# Patient Record
Sex: Male | Born: 1985 | Race: Black or African American | Hispanic: No | Marital: Single | Smoking: Current every day smoker
Health system: Southern US, Community
[De-identification: ages and names within clinical notes are randomized; demographics above are authoritative.]

## PROBLEM LIST (undated history)

## (undated) DIAGNOSIS — W3400XA Accidental discharge from unspecified firearms or gun, initial encounter: Secondary | ICD-10-CM

## (undated) DIAGNOSIS — F329 Major depressive disorder, single episode, unspecified: Secondary | ICD-10-CM

## (undated) DIAGNOSIS — F22 Delusional disorders: Secondary | ICD-10-CM

## (undated) DIAGNOSIS — R0600 Dyspnea, unspecified: Secondary | ICD-10-CM

## (undated) DIAGNOSIS — S82899A Other fracture of unspecified lower leg, initial encounter for closed fracture: Secondary | ICD-10-CM

## (undated) DIAGNOSIS — F32A Depression, unspecified: Secondary | ICD-10-CM

## (undated) DIAGNOSIS — R569 Unspecified convulsions: Secondary | ICD-10-CM

## (undated) HISTORY — PX: FRACTURE SURGERY: SHX138

## (undated) HISTORY — PX: BRAIN SURGERY: SHX531

## (undated) HISTORY — PX: LEG SURGERY: SHX1003

---

## 2012-01-01 ENCOUNTER — Emergency Department (HOSPITAL_COMMUNITY): Payer: Self-pay

## 2012-01-01 ENCOUNTER — Inpatient Hospital Stay (HOSPITAL_COMMUNITY)
Admission: EM | Admit: 2012-01-01 | Discharge: 2012-01-01 | DRG: 948 | Disposition: A | Discharge status: Home / Self Care | Payer: Self-pay | Attending: Emergency Medicine | Admitting: Emergency Medicine

## 2012-01-01 ENCOUNTER — Encounter (HOSPITAL_COMMUNITY): Payer: Self-pay | Admitting: Emergency Medicine

## 2012-01-01 ENCOUNTER — Other Ambulatory Visit: Payer: Self-pay

## 2012-01-01 DIAGNOSIS — I498 Other specified cardiac arrhythmias: Secondary | ICD-10-CM | POA: Diagnosis present

## 2012-01-01 DIAGNOSIS — R4182 Altered mental status, unspecified: Principal | ICD-10-CM | POA: Diagnosis present

## 2012-01-01 DIAGNOSIS — R5381 Other malaise: Secondary | ICD-10-CM | POA: Diagnosis present

## 2012-01-01 DIAGNOSIS — Y92009 Unspecified place in unspecified non-institutional (private) residence as the place of occurrence of the external cause: Secondary | ICD-10-CM

## 2012-01-01 DIAGNOSIS — T40601A Poisoning by unspecified narcotics, accidental (unintentional), initial encounter: Secondary | ICD-10-CM

## 2012-01-01 DIAGNOSIS — S7290XD Unspecified fracture of unspecified femur, subsequent encounter for closed fracture with routine healing: Secondary | ICD-10-CM

## 2012-01-01 LAB — CBC WITH DIFFERENTIAL/PLATELET
Basophils Absolute: 0 10*3/uL (ref 0.0–0.1)
Lymphocytes Relative: 15 % (ref 12–46)
Neutro Abs: 8.4 10*3/uL — ABNORMAL HIGH (ref 1.7–7.7)
Neutrophils Relative %: 75 % (ref 43–77)
Platelets: 277 10*3/uL (ref 150–400)
RDW: 13.5 % (ref 11.5–15.5)
WBC: 11.1 10*3/uL — ABNORMAL HIGH (ref 4.0–10.5)

## 2012-01-01 LAB — RAPID URINE DRUG SCREEN, HOSP PERFORMED
Barbiturates: NOT DETECTED
Cocaine: NOT DETECTED
Tetrahydrocannabinol: POSITIVE — AB

## 2012-01-01 LAB — COMPREHENSIVE METABOLIC PANEL
ALT: 22 U/L (ref 0–53)
AST: 45 U/L — ABNORMAL HIGH (ref 0–37)
CO2: 23 mEq/L (ref 19–32)
Calcium: 9.1 mg/dL (ref 8.4–10.5)
Chloride: 101 mEq/L (ref 96–112)
GFR calc non Af Amer: >90 mL/min (ref >90)
Sodium: 139 mEq/L (ref 135–145)
Total Bilirubin: 1.6 mg/dL — ABNORMAL HIGH (ref 0.3–1.2)

## 2012-01-01 MED ORDER — NALOXONE HCL 1 MG/ML IJ SOLN: 1.0000 mg | Freq: Once | INTRAMUSCULAR | Status: DC

## 2012-01-01 MED ORDER — SODIUM CHLORIDE 0.9 % IV SOLN
INTRAVENOUS | Status: DC
  Administered 2012-01-01: 19:00:00 via INTRAVENOUS

## 2012-01-01 MED ORDER — NALOXONE HCL 0.4 MG/ML IJ SOLN
INTRAMUSCULAR | Status: AC
  Filled 2012-01-01: qty 1

## 2012-01-01 MED ORDER — ZIPRASIDONE MESYLATE 20 MG IM SOLR
20.0000 mg | Freq: Once | INTRAMUSCULAR | Status: DC
  Filled 2012-01-01: qty 20

## 2012-01-01 MED ORDER — LORAZEPAM 2 MG/ML IJ SOLN
1.0000 mg | Freq: Once | INTRAMUSCULAR | Status: AC
  Administered 2012-01-01: 1 mg via INTRAVENOUS
  Filled 2012-01-01: qty 1

## 2012-01-01 NOTE — ED Notes (Signed)
Pt returned from xray, alert and oriented per normal, stated he wants to leave, pt refusing care and arguing with family at bedside. MD Ignacia Palma and PA notified.
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
## 2012-01-01 NOTE — ED Notes (Signed)
Upon entering room pt noted to be pale and diaphoretic, pt vital signs rechecked and within normal limits, pt with repetitive statements and behaving inappropriately, checked with previous RN that patient was not diaphoretic upon arrival, MD notified. EKG being obtained.

## 2012-01-01 NOTE — ED Notes (Signed)
Pt calm and cooperative at this time, requesting to be discharged, explained to patient that he cannot leave at this time due to IVC paperwork. Pt alert and oriented.
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
## 2012-01-01 NOTE — ED Notes (Signed)
Pt asking why he is being admitted, states he wants to leave, MD aware, pt alert and oriented.

## 2012-01-01 NOTE — ED Notes (Addendum)
Pt remains in radiology at this time. Family to bedside, states they suspect that patient started using unknown drugs again yesterday, states patient left house yesterday and abnormal behavior per family started yesterday evening, states this behavior is consistent of patient when he has used drugs in the past.
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
## 2012-01-08 DIAGNOSIS — S7292XA Unspecified fracture of left femur, initial encounter for closed fracture: Secondary | ICD-10-CM | POA: Insufficient documentation

## 2012-01-16 ENCOUNTER — Encounter (HOSPITAL_COMMUNITY): Payer: Self-pay | Admitting: *Deleted

## 2012-01-16 ENCOUNTER — Emergency Department (HOSPITAL_COMMUNITY)
Admission: EM | Admit: 2012-01-16 | Discharge: 2012-01-16 | Disposition: A | Discharge status: Home / Self Care | Payer: Self-pay | Attending: Emergency Medicine | Admitting: Emergency Medicine

## 2012-01-16 DIAGNOSIS — Z87828 Personal history of other (healed) physical injury and trauma: Secondary | ICD-10-CM | POA: Insufficient documentation

## 2012-01-16 DIAGNOSIS — T391X1A Poisoning by 4-Aminophenol derivatives, accidental (unintentional), initial encounter: Secondary | ICD-10-CM | POA: Insufficient documentation

## 2012-01-16 DIAGNOSIS — M79609 Pain in unspecified limb: Secondary | ICD-10-CM | POA: Insufficient documentation

## 2012-01-16 DIAGNOSIS — M79606 Pain in leg, unspecified: Secondary | ICD-10-CM

## 2012-01-16 DIAGNOSIS — F17200 Nicotine dependence, unspecified, uncomplicated: Secondary | ICD-10-CM | POA: Insufficient documentation

## 2012-01-16 HISTORY — DX: Accidental discharge from unspecified firearms or gun, initial encounter: W34.00XA

## 2012-01-16 LAB — ETHANOL: Alcohol, Ethyl (B): <11 mg/dL (ref 0–11)

## 2012-01-16 LAB — COMPREHENSIVE METABOLIC PANEL
AST: 15 U/L (ref 0–37)
Albumin: 4.3 g/dL (ref 3.5–5.2)
Alkaline Phosphatase: 102 U/L (ref 39–117)
Chloride: 100 mEq/L (ref 96–112)
Creatinine, Ser: 1.07 mg/dL (ref 0.50–1.35)
Potassium: 3.9 mEq/L (ref 3.5–5.1)
Sodium: 138 mEq/L (ref 135–145)
Total Bilirubin: 0.9 mg/dL (ref 0.3–1.2)

## 2012-01-16 LAB — RAPID URINE DRUG SCREEN, HOSP PERFORMED
Barbiturates: NOT DETECTED
Tetrahydrocannabinol: POSITIVE — AB

## 2012-01-16 LAB — CBC WITH DIFFERENTIAL/PLATELET
Eosinophils Absolute: 0.3 10*3/uL (ref 0.0–0.7)
Eosinophils Relative: 2 % (ref 0–5)
Lymphs Abs: 3 10*3/uL (ref 0.7–4.0)
MCH: 25.8 pg — ABNORMAL LOW (ref 26.0–34.0)
MCV: 80.4 fL (ref 78.0–100.0)
Monocytes Relative: 10 % (ref 3–12)
Platelets: 584 10*3/uL — ABNORMAL HIGH (ref 150–400)
RBC: 4.84 MIL/uL (ref 4.22–5.81)

## 2012-01-16 MED ORDER — ONDANSETRON HCL 4 MG/2ML IJ SOLN
4.0000 mg | Freq: Once | INTRAMUSCULAR | Status: AC
  Administered 2012-01-16: 4 mg via INTRAVENOUS
  Filled 2012-01-16: qty 2

## 2012-01-16 MED ORDER — SODIUM CHLORIDE 0.9 % IV SOLN
Freq: Once | INTRAVENOUS | Status: AC
  Administered 2012-01-16: 04:00:00 via INTRAVENOUS

## 2012-01-16 NOTE — ED Notes (Signed)
NAD noted at time of d/c home 

## 2012-01-16 NOTE — ED Provider Notes (Signed)
Medical screening examination/treatment/procedure(s) were performed by non-physician practitioner and as supervising physician I was immediately available for consultation/collaboration.  Olivia Mackie, MD 01/16/12 [phone]

## 2012-01-16 NOTE — ED Notes (Signed)
Pt states he took 5-500mg  tylenols around 10-11am yesterday, and then 7 at 0200 today.

## 2012-01-16 NOTE — ED Notes (Signed)
Updated family member on pt. Health status.  Will see if she can visit in 10 Minutes.
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
## 2012-01-16 NOTE — ED Notes (Signed)
Pt appears very tired, states he is feeling sick. Pt states he has vomited multiple times, around 7-8 times. Pt sates his left leg is aching.
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
## 2012-02-09 ENCOUNTER — Emergency Department (INDEPENDENT_AMBULATORY_CARE_PROVIDER_SITE_OTHER): Payer: Self-pay

## 2012-02-09 ENCOUNTER — Encounter (HOSPITAL_COMMUNITY): Payer: Self-pay | Admitting: *Deleted

## 2012-02-09 ENCOUNTER — Emergency Department (INDEPENDENT_AMBULATORY_CARE_PROVIDER_SITE_OTHER)
Admission: EM | Admit: 2012-02-09 | Discharge: 2012-02-09 | Disposition: A | Discharge status: Home / Self Care | Payer: Self-pay | Source: Home / Self Care | Attending: Family Medicine | Admitting: Family Medicine

## 2012-02-09 DIAGNOSIS — M79609 Pain in unspecified limb: Secondary | ICD-10-CM

## 2012-02-09 DIAGNOSIS — M79605 Pain in left leg: Secondary | ICD-10-CM

## 2012-02-09 MED ORDER — OXYCODONE-ACETAMINOPHEN 10-325 MG PO TABS: 1.0000 | ORAL_TABLET | Freq: Four times a day (QID) | ORAL | Status: DC | PRN

## 2012-02-09 MED ORDER — HYDROCODONE-ACETAMINOPHEN 5-325 MG PO TABS
ORAL_TABLET | ORAL | Status: AC
  Filled 2012-02-09: qty 1

## 2012-02-09 MED ORDER — TRAMADOL HCL 50 MG PO TABS: 50.0000 mg | ORAL_TABLET | Freq: Four times a day (QID) | ORAL | Status: AC | PRN

## 2012-02-09 MED ORDER — HYDROCODONE-ACETAMINOPHEN 5-325 MG PO TABS
1.0000 | ORAL_TABLET | Freq: Once | ORAL | Status: AC
Start: 2012-02-09 — End: 2012-02-09
  Administered 2012-02-09: 1 via ORAL
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
## 2012-02-10 NOTE — ED Provider Notes (Signed)
Medical screening examination/treatment/procedure(s) were performed by resident physician or non-physician practitioner and as supervising physician I was immediately available for consultation/collaboration.   Barkley Bruns MD.    Linna Hoff, MD 02/10/12 [phone]

## 2012-04-22 DIAGNOSIS — M898X5 Other specified disorders of bone, thigh: Secondary | ICD-10-CM | POA: Insufficient documentation

## 2013-11-19 IMAGING — CT CT CERVICAL SPINE W/O CM
3 of 5 series · 8 of 20 positions shown, 9 images · non-contrast
Comparison: None

CT HEAD

Fall.  Trauma back of head.  Head and neck pain.  CT HEAD WITHOUT
CONTRAST
CT CERVICAL SPINE WITHOUT CONTRAST
TECHNIQUE: Multidetector CT imaging of the head and cervical spine
was performed following the standard protocol without intravenous
contrast.  Multiplanar CT image reconstructions of the cervical
spine were also generated.

[Series 3: recon 2: brain · axial · 0.47mm/px · z∈[-56,+27]mm · 3 of 64 slices shown]
[im 16/64  bone]
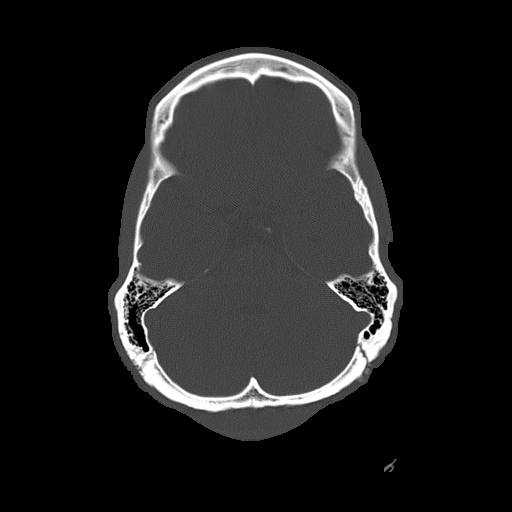
[im 32/64  bone]
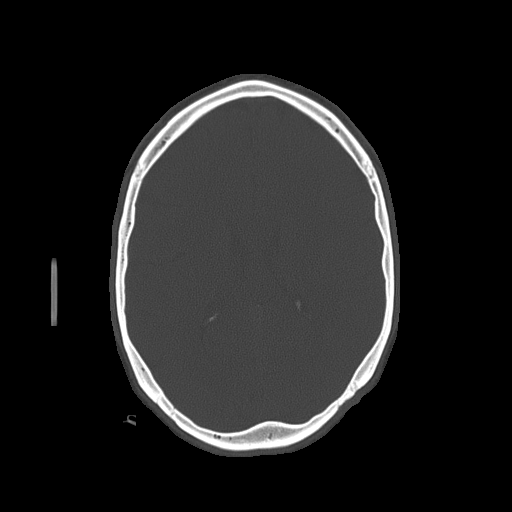
[im 48/64  bone]
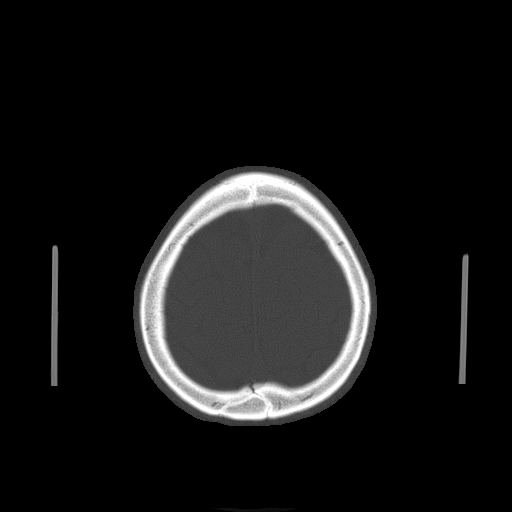

[Series 5: recon 2: c-spine · axial · 0.31mm/px · z∈[-251,-138]mm · 4 of 74 slices shown, 5 images]
[im 15/74  soft-tissue]
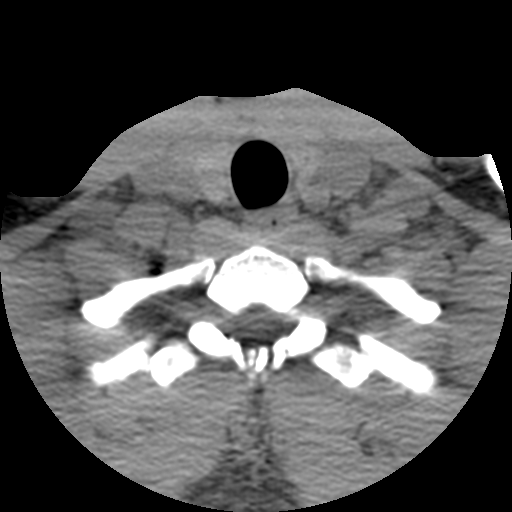
[im 15/74  bone]
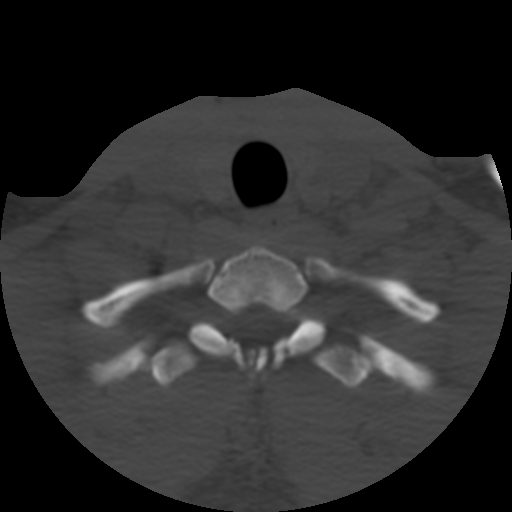
[im 30/74  bone]
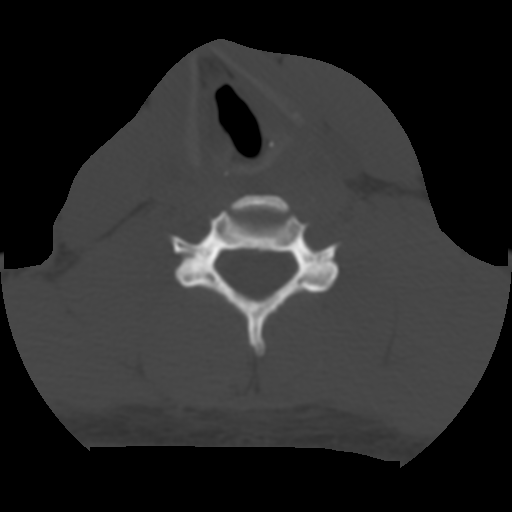
[im 44/74  bone]
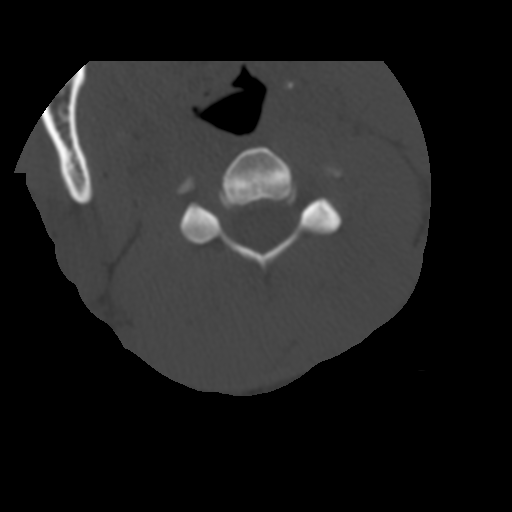
[im 59/74  bone]
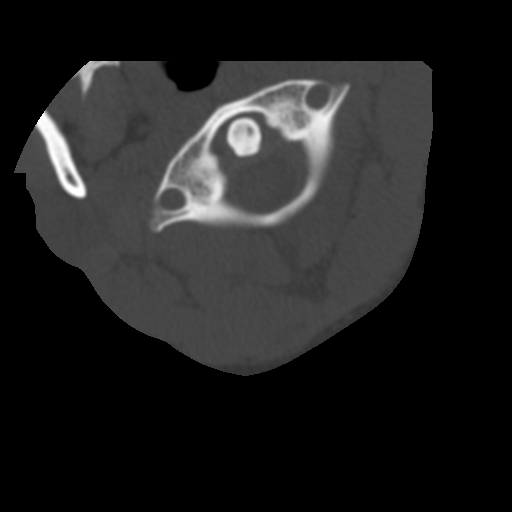

[Series 601: cor · coronal · 0.37mm/px · 1 of 34 slices shown]
[im 17/34  bone]
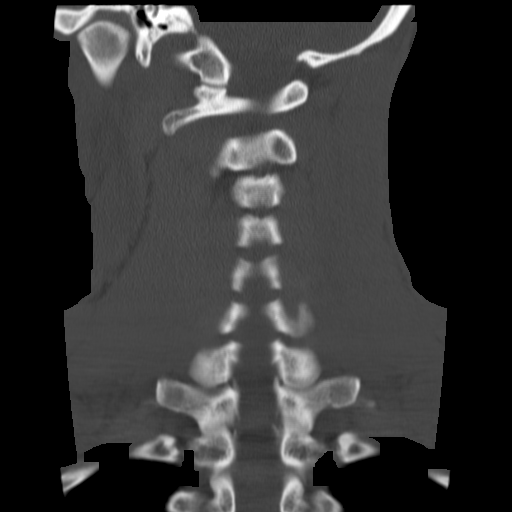

[8 of 20 positions shown; findings below may reference images not displayed]

FINDINGS: No acute cortical infarct, hemorrhage, or mass lesion is
present.  The ventricles are of normal size.  No significant extra-
axial fluid collection is present.

The paranasal sinuses and mastoid air cells are clear.  The osseous
skull is intact.

No significant extracranial soft tissue injury is evident.
IMPRESSION: Negative CT of the head.

CT CERVICAL SPINE
FINDINGS: The cervical spine is visualized from skull base through
T2.  The vertebral body heights and alignment are intact.  No acute
fracture or traumatic subluxation is evident.  There is
straightening of the normal cervical lordosis.  The soft tissues
are unremarkable.  The lung apices are clear.
IMPRESSION: 1.  No acute fracture or traumatic subluxation.
2.  Mild straightening of the normal cervical lordosis.  While this
is most likely positional, it can be seen in the setting of ongoing
pain or muscle strain.

## 2015-12-30 ENCOUNTER — Emergency Department (HOSPITAL_COMMUNITY)
Admission: EM | Admit: 2015-12-30 | Discharge: 2015-12-30 | Disposition: A | Discharge status: Home / Self Care | Payer: Self-pay | Attending: Emergency Medicine | Admitting: Emergency Medicine

## 2015-12-30 ENCOUNTER — Emergency Department (HOSPITAL_COMMUNITY): Payer: Self-pay

## 2015-12-30 ENCOUNTER — Encounter (HOSPITAL_COMMUNITY): Payer: Self-pay

## 2015-12-30 DIAGNOSIS — F17210 Nicotine dependence, cigarettes, uncomplicated: Secondary | ICD-10-CM | POA: Insufficient documentation

## 2015-12-30 DIAGNOSIS — R0789 Other chest pain: Secondary | ICD-10-CM | POA: Insufficient documentation

## 2015-12-30 DIAGNOSIS — Z79899 Other long term (current) drug therapy: Secondary | ICD-10-CM | POA: Insufficient documentation

## 2015-12-30 MED ORDER — NAPROXEN 500 MG PO TABS: 500.0000 mg | ORAL_TABLET | Freq: Two times a day (BID) | ORAL | Status: DC

## 2015-12-30 MED ORDER — CYCLOBENZAPRINE HCL 10 MG PO TABS: 10.0000 mg | ORAL_TABLET | Freq: Two times a day (BID) | ORAL | Status: DC | PRN

## 2015-12-30 NOTE — Discharge Instructions (Signed)
Chest Wall Pain °Chest wall pain is pain in or around the bones and muscles of your chest. Sometimes, an injury causes this pain. Sometimes, the cause may not be known. This pain may take several weeks or longer to get better. °HOME CARE °Pay attention to any changes in your symptoms. Take these actions to help with your pain: °· Rest as told by your doctor. °· Avoid activities that cause pain. Try not to use your chest, belly (abdominal), or side muscles to lift heavy things. °· If directed, apply ice to the painful area: °¨ Put ice in a plastic bag. °¨ Place a towel between your skin and the bag. °¨ Leave the ice on for 20 minutes, 2-3 times per day. °· Take over-the-counter and prescription medicines only as told by your doctor. °· Do not use tobacco products, including cigarettes, chewing tobacco, and e-cigarettes. If you need help quitting, ask your doctor. °· Keep all follow-up visits as told by your doctor. This is important. °GET HELP IF: °· You have a fever. °· Your chest pain gets worse. °· You have new symptoms. °GET HELP RIGHT AWAY IF: °· You feel sick to your stomach (nauseous) or you throw up (vomit). °· You feel sweaty or light-headed. °· You have a cough with phlegm (sputum) or you cough up blood. °· You are short of breath. °  °This information is not intended to replace advice given to you by your health care provider. Make sure you discuss any questions you have with your health care provider. °  °Document Released: 11/13/2007 Document Revised: 02/15/2015 Document Reviewed: 08/22/2014 °Elsevier Interactive Patient Education ©2016 Elsevier Inc. ° °

## 2015-12-30 NOTE — ED Notes (Signed)
Sitting on side of stretcher - called RN to room - states "I'm in pain". Advised pt B Tran, [place], will be in to assess him soon. Voiced understanding.

## 2015-12-30 NOTE — ED Notes (Signed)
Patient here with 1 week of right side pain. Pain worse with any inspiration and movement, denies trauma. denies cold symptoms. Alert and oriented, NAD
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
## 2018-05-04 ENCOUNTER — Other Ambulatory Visit: Payer: Self-pay | Admitting: Podiatry

## 2018-05-04 ENCOUNTER — Ambulatory Visit (INDEPENDENT_AMBULATORY_CARE_PROVIDER_SITE_OTHER): Payer: Self-pay | Admitting: Podiatry

## 2018-05-04 ENCOUNTER — Ambulatory Visit (INDEPENDENT_AMBULATORY_CARE_PROVIDER_SITE_OTHER): Payer: Self-pay

## 2018-05-04 ENCOUNTER — Ambulatory Visit: Payer: Self-pay

## 2018-05-04 DIAGNOSIS — S8252XA Displaced fracture of medial malleolus of left tibia, initial encounter for closed fracture: Secondary | ICD-10-CM

## 2018-05-04 DIAGNOSIS — M25471 Effusion, right ankle: Secondary | ICD-10-CM

## 2018-05-04 DIAGNOSIS — M7661 Achilles tendinitis, right leg: Secondary | ICD-10-CM

## 2018-05-04 MED ORDER — OXYCODONE-ACETAMINOPHEN 5-325 MG PO TABS: 1.0000 | ORAL_TABLET | Freq: Three times a day (TID) | ORAL | 0 refills | Status: AC | PRN

## 2018-05-04 NOTE — Patient Instructions (Signed)
Pre-Operative Instructions  Congratulations, you have decided to take an important step towards improving your quality of life.  You can be assured that the doctors and staff at [facility] will be with you every step of the way.  Here are some important things you should know:  1. Plan to be at the surgery center/hospital at least 1 (one) hour prior to your scheduled time, unless otherwise directed by the surgical center/hospital staff.  You must have a responsible adult accompany you, remain during the surgery and drive you home.  Make sure you have directions to the surgical center/hospital to ensure you arrive on time. 2. If you are having surgery at [facility] or [facility], you will need a copy of your medical history and physical form from your family physician within one month prior to the date of surgery. We will give you a form for your primary physician to complete.  3. We make every effort to accommodate the date you request for surgery.  However, there are times where surgery dates or times have to be moved.  We will contact you as soon as possible if a change in schedule is required.   4. No aspirin/ibuprofen for one week before surgery.  If you are on aspirin, any non-steroidal anti-inflammatory medications (Mobic, Aleve, Ibuprofen) should not be taken seven (7) days prior to your surgery.  You make take Tylenol for pain prior to surgery.  5. Medications - If you are taking daily heart and blood pressure medications, seizure, reflux, allergy, asthma, anxiety, pain or diabetes medications, make sure you notify the surgery center/hospital before the day of surgery so they can tell you which medications you should take or avoid the day of surgery. 6. No food or drink after midnight the night before surgery unless directed otherwise by surgical center/hospital staff. 7. No alcoholic beverages 24-hours prior to surgery.  No smoking 24-hours prior or 24-hours after  surgery. 8. Wear loose pants or shorts. They should be loose enough to fit over bandages, boots, and casts. 9. Don't wear slip-on shoes. Sneakers are preferred. 10. Bring your boot with you to the surgery center/hospital.  Also bring crutches or a walker if your physician has prescribed it for you.  If you do not have this equipment, it will be provided for you after surgery. 11. If you have not been contacted by the surgery center/hospital by the day before your surgery, call to confirm the date and time of your surgery. 12. Leave-time from work may vary depending on the type of surgery you have.  Appropriate arrangements should be made prior to surgery with your employer. 13. Prescriptions will be provided immediately following surgery by your doctor.  Fill these as soon as possible after surgery and take the medication as directed. Pain medications will not be refilled on weekends and must be approved by the doctor. 14. Remove nail polish on the operative foot and avoid getting pedicures prior to surgery. 15. Wash the night before surgery.  The night before surgery wash the foot and leg well with water and the antibacterial soap provided. Be sure to pay special attention to beneath the toenails and in between the toes.  Wash for at least three (3) minutes. Rinse thoroughly with water and dry well with a towel.  Perform this wash unless told not to do so by your physician.  Enclosed: 1 Ice pack (please put in freezer the night before surgery)   1 Hibiclens skin cleaner     Pre-op instructions  If you have any questions regarding the instructions, please do not hesitate to call our office.  [place]: [address], [place], [place] [address] -- [phone]  [place]: [address] [place], [place] [address] -- [phone]  [place]: [address]  [place], [place] [address] -- [phone]  [place]: [address], [address], [place], [place] [address] -- [phone]  Website:  [email or web address] 
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
## 2018-05-06 ENCOUNTER — Telehealth: Payer: Self-pay | Admitting: *Deleted

## 2018-05-06 ENCOUNTER — Encounter: Payer: Self-pay | Admitting: *Deleted

## 2018-05-06 NOTE — Telephone Encounter (Signed)
I'm calling to get the name of your primary care physician.  "Let me let you speak to my baby's mama so she can give you the name."  His girlfriend stated the doctor's name is Dr. Pecola Leisureeese and she's at [facility].  I asked her if he's scheduled to see Dr. Pecola Leisureeese on December 11.  She said he is scheduled for that date.  Per Dr. Logan BoresEvans, I sent a medical clearance request letter to Dr. Leilani AbleBetti Reese.

## 2018-05-15 ENCOUNTER — Telehealth: Payer: Self-pay | Admitting: *Deleted

## 2018-05-15 NOTE — Telephone Encounter (Addendum)
I am calling you in regards to your surgery scheduled for December 12.  We have not received clearance from Dr. Pecola Leisureeese.  I have sent the request twice.  Dr. Logan BoresEvans will not be able to do your surgery without clearance from your doctor.  I have an appointment with them on January 20."  So, we need to reschedule your surgery to next year, after January 20? "My wife is taking care of all this, can you call her?  Her number is [phone]."  I will give her a call.  Your husband asked me to call you regarding his surgery that's scheduled for December 12.  Dr. Pecola Leisureeese has not given him medical clearance.  "We had to reschedule his appointment.  It was supposed to have been December 11 but they called and said she couldn't do it that day.  They rescheduled him to December 20.  We can't do the surgery the following week because we will be out of town.  We're going to have to reschedule his [facility] to [facility].  The surgical center told us that we would have to pay $2500 up front for the surgery.  We don't have that.  I called [facility] and they said we could pay $400 up front then make payment arrangements.  So, we want to schedule it there."  If [facility], he will need a history and physical form completed by his primary care physician.  "I thought that was what he was doing."  No, we had requested medical clearance.  [facility] requires these actual forms.  Can you come by to pick them up?  "Can you mail them to us?"  Yes, I will mail them.  Do you want to schedule the date for [facility]?  "Yes, when can he do it?"  He can do it on June 11, 2018.  "Okay, that will be fine."    I put the history and physical forms in the mail.  I called he surgical center and canceled the surgery for December 12.  I called and rescheduled the surgery for [facility] for 06/11/2018.

## 2018-05-18 ENCOUNTER — Telehealth: Payer: Self-pay | Admitting: Podiatry

## 2018-05-22 NOTE — Telephone Encounter (Signed)
Pt's wife, Margaretmary LombardFatima called wanting to get the estimate, where the surgery will be, date of surgery and other information. Requested a call back at [phone].

## 2018-05-22 NOTE — Telephone Encounter (Signed)
Tried calling pt's wife to follow up from message she left the other day. I was told to call back.

## 2018-05-27 ENCOUNTER — Encounter: Payer: Self-pay | Admitting: Podiatry

## 2018-05-28 NOTE — Pre-Procedure Instructions (Signed)
Charles Trevino  05/28/2018      [id number] [place] - [address] [place] [address] Phone: [phone] Fax: [phone]  [facility] [address] ([address]), [place] - [address] [place] [address] Phone: [phone] Fax: [phone]    Your procedure is scheduled on June 11, 2018.  [facility] to [facility] at 645 AM.  Call this number if you have problems the morning of surgery:  [phone]   Remember:  Do not eat or drink after midnight.    Take these medicines the morning of surgery with A SIP OF WATER  Tylenol-if needed for pain or hydrocodone-acetaminophen or oxycodone-acetaminophen-if needed for pain Flexeril-If needed for muscle spasms  7 days prior to surgery STOP taking any Aspirin (unless otherwise instructed by your surgeon), Aleve, Naproxen, Ibuprofen, Motrin, Advil, Goody's, BC's, all herbal medications, fish oil, and all vitamins    Do not wear jewelry  Do not wear lotions, powders, or colognes, or deodorant.  Men may shave face and neck.  Do not bring valuables to the hospital.  [facility] is not responsible for any belongings or valuables.  Contacts, dentures or bridgework may not be worn into surgery.  Leave your suitcase in the car.  After surgery it may be brought to your room.  For patients admitted to the hospital, discharge time will be determined by your treatment team.  Patients discharged the day of surgery will not be allowed to drive home.    [place]- Preparing For Surgery  Before surgery, you can play an important role. Because skin is not sterile, your skin needs to be as free of germs as possible. You can reduce the number of germs on your skin by washing with CHG (chlorahexidine gluconate) Soap before surgery.  CHG is an antiseptic cleaner which kills germs and bonds with the skin to continue killing germs even after washing.     Oral Hygiene is also important to reduce your risk of infection.  Remember - BRUSH YOUR TEETH THE MORNING OF SURGERY WITH YOUR REGULAR TOOTHPASTE  Please do not use if you have an allergy to CHG or antibacterial soaps. If your skin becomes reddened/irritated stop using the CHG.  Do not shave (including legs and underarms) for at least 48 hours prior to first CHG shower. It is OK to shave your face.  Please follow these instructions carefully.   1. Shower the NIGHT BEFORE SURGERY and the MORNING OF SURGERY with CHG.   2. If you chose to wash your hair, wash your hair first as usual with your normal shampoo.  3. After you shampoo, rinse your hair and body thoroughly to remove the shampoo.  4. Use CHG as you would any other liquid soap. You can apply CHG directly to the skin and wash gently with a scrungie or a clean washcloth.   5. Apply the CHG Soap to your body ONLY FROM THE NECK DOWN.  Do not use on open wounds or open sores. Avoid contact with your eyes, ears, mouth and genitals (private parts). Wash Face and genitals (private parts)  with your normal soap.  6. Wash thoroughly, paying special attention to the area where your surgery will be performed.  7. Thoroughly rinse your body with warm water from the neck down.  8. DO NOT shower/wash with your normal soap after using and rinsing off the CHG Soap.  9. Pat yourself dry  with a CLEAN TOWEL.  10. Wear CLEAN PAJAMAS to bed the night before surgery, wear comfortable clothes the morning of surgery  11. Place CLEAN SHEETS on your bed the night of your first shower and DO NOT SLEEP WITH PETS.  Day of Surgery:  Do not apply any deodorants/lotions.  Please wear clean clothes to the hospital/surgery center.   Remember to brush your teeth WITH YOUR REGULAR TOOTHPASTE.   Please read over the following fact sheets that you were given.

## 2018-05-29 ENCOUNTER — Inpatient Hospital Stay (HOSPITAL_COMMUNITY)
Admission: RE | Admit: 2018-05-29 | Discharge: 2018-05-29 | Disposition: A | Discharge status: Home / Self Care | Payer: Self-pay | Source: Ambulatory Visit

## 2018-05-29 NOTE — Progress Notes (Addendum)
Patient unable to come to PAT appointment and requests to be rescheduled. Informed PAT appointment scheduler and she will reschedule PAT appointment for patient

## 2018-06-08 ENCOUNTER — Encounter: Payer: Self-pay | Admitting: Podiatry

## 2018-06-09 ENCOUNTER — Other Ambulatory Visit: Payer: Self-pay

## 2018-06-09 ENCOUNTER — Encounter (HOSPITAL_COMMUNITY): Payer: Self-pay | Admitting: *Deleted

## 2018-06-09 NOTE — Progress Notes (Signed)
Pt SDW-pre-op incomplete -education and pre-op instructions were not completed in the assessment ; left pre-op instructions on pt voice mailbox. Pt stated " I get SOB from anxiety and I get paranoid sometimes." Pt stated that he has been having chest pain for 3-4 months. Pt advised to seek medical attention immediately for chest pain in the future. Pt stated " I told the doctor." Pt stated that he has been having seizures for the past year and has not been evaluated by a medical doctor or neurologist; pt stated that last seizure was " Sunday." Pt made aware to f/u with a medical doctor for history of seizures. Pt made aware to stop taking vitamins, fish oil and herbal medications. Do not take any NSAIDs ie: Ibuprofen, Advil, Naproxen (Aleve), Motrin, BC and Goody Powder.

## 2018-06-09 NOTE — Progress Notes (Signed)
Dr. Valarie MerinoW. E. Fitzgerald, Anesthesiology, made aware of pt history; MD advised that pt be evaluated upon arrival on DOS.

## 2018-06-10 MED ORDER — CEFAZOLIN SODIUM-DEXTROSE 2-4 GM/100ML-% IV SOLN
2.0000 g | INTRAVENOUS | Status: AC
  Filled 2018-06-10: qty 100

## 2018-06-10 MED ORDER — DEXTROSE 5 % IV SOLN
3.0000 g | INTRAVENOUS | Status: DC
  Filled 2018-06-10: qty 3000

## 2018-06-11 ENCOUNTER — Ambulatory Visit (HOSPITAL_COMMUNITY): Admission: RE | Admit: 2018-06-11 | Payer: Self-pay | Source: Home / Self Care | Admitting: Podiatry

## 2018-06-11 ENCOUNTER — Encounter (HOSPITAL_COMMUNITY): Payer: Self-pay | Admitting: Registered Nurse

## 2018-06-11 ENCOUNTER — Telehealth: Payer: Self-pay | Admitting: *Deleted

## 2018-06-11 HISTORY — DX: Other fracture of unspecified lower leg, initial encounter for closed fracture: S82.899A

## 2018-06-11 HISTORY — DX: Major depressive disorder, single episode, unspecified: F32.9

## 2018-06-11 HISTORY — DX: Unspecified convulsions: R56.9

## 2018-06-11 HISTORY — DX: Depression, unspecified: F32.A

## 2018-06-11 HISTORY — DX: Dyspnea, unspecified: R06.00

## 2018-06-11 HISTORY — DX: Delusional disorders: F22

## 2018-06-11 SURGERY — OPEN REDUCTION INTERNAL FIXATION (ORIF) ANKLE FRACTURE
Anesthesia: General | Laterality: Right

## 2018-06-11 NOTE — Telephone Encounter (Signed)
I am returning your call.  You can call his significant other, Charles Trevino, at [phone] home and [phone] mobile.  This information is located in the demographics section.  If you have any other questions, give me a call.

## 2018-06-11 NOTE — OR Nursing (Signed)
Patient no show to hospital and unable to reach per [facility].  Contacted Dr. Logan Bores.  Procedure cancelled.

## 2018-06-11 NOTE — Telephone Encounter (Signed)
"  Calling to see if you have a different number for this patient than what's in the system.  We have been trying to reach him.  Please give me a call."

## 2018-06-17 ENCOUNTER — Encounter: Payer: Self-pay | Admitting: Podiatry

## 2018-06-23 NOTE — Progress Notes (Signed)
This encounter was created in error - please disregard.

## 2018-06-29 ENCOUNTER — Encounter: Payer: Self-pay | Admitting: *Deleted

## 2018-06-29 NOTE — Progress Notes (Signed)
This encounter was created in error - please disregard.

## 2018-07-02 ENCOUNTER — Encounter (HOSPITAL_COMMUNITY): Payer: Self-pay | Admitting: Emergency Medicine

## 2018-07-02 ENCOUNTER — Emergency Department (HOSPITAL_COMMUNITY)
Admission: EM | Admit: 2018-07-02 | Discharge: 2018-07-02 | Disposition: A | Discharge status: Home / Self Care | Payer: Self-pay | Attending: Emergency Medicine | Admitting: Emergency Medicine

## 2018-07-02 ENCOUNTER — Telehealth: Payer: Self-pay | Admitting: *Deleted

## 2018-07-02 DIAGNOSIS — Y939 Activity, unspecified: Secondary | ICD-10-CM | POA: Insufficient documentation

## 2018-07-02 DIAGNOSIS — Y929 Unspecified place or not applicable: Secondary | ICD-10-CM | POA: Insufficient documentation

## 2018-07-02 DIAGNOSIS — F17210 Nicotine dependence, cigarettes, uncomplicated: Secondary | ICD-10-CM | POA: Insufficient documentation

## 2018-07-02 DIAGNOSIS — Z79899 Other long term (current) drug therapy: Secondary | ICD-10-CM | POA: Insufficient documentation

## 2018-07-02 DIAGNOSIS — S8251XA Displaced fracture of medial malleolus of right tibia, initial encounter for closed fracture: Secondary | ICD-10-CM | POA: Insufficient documentation

## 2018-07-02 DIAGNOSIS — Y999 Unspecified external cause status: Secondary | ICD-10-CM | POA: Insufficient documentation

## 2018-07-02 DIAGNOSIS — X58XXXA Exposure to other specified factors, initial encounter: Secondary | ICD-10-CM | POA: Insufficient documentation

## 2018-07-02 MED ORDER — IBUPROFEN 600 MG PO TABS: 600.0000 mg | ORAL_TABLET | Freq: Four times a day (QID) | ORAL | 0 refills | Status: AC | PRN

## 2018-07-02 MED ORDER — IBUPROFEN 400 MG PO TABS
600.0000 mg | ORAL_TABLET | Freq: Once | ORAL | Status: AC
  Administered 2018-07-02: 600 mg via ORAL
  Filled 2018-07-02: qty 1

## 2018-07-02 MED ORDER — LOPERAMIDE HCL 2 MG PO CAPS
2.0000 mg | ORAL_CAPSULE | Freq: Once | ORAL | Status: AC
Start: 2018-07-02 — End: 2018-07-02
  Administered 2018-07-02: 2 mg via ORAL
  Filled 2018-07-02: qty 1

## 2018-07-02 MED ORDER — LOPERAMIDE HCL 2 MG PO CAPS: 2.0000 mg | ORAL_CAPSULE | Freq: Two times a day (BID) | ORAL | 0 refills | Status: AC | PRN
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
## 2018-07-02 NOTE — ED Notes (Signed)
Pt in handcuffs, placed in recliner with [employer] at bedside.

## 2018-07-02 NOTE — ED Triage Notes (Signed)
Pt with right foot pain from ankle injury. He reports that he was supposed to have surgery today but he was arrested for multiple warrants and is not in the custody of [employer]. Pt in need of medical clearance as he is unable to go to jail with a cam walker boot on his foot. Denies new injury.

## 2018-07-02 NOTE — Discharge Instructions (Signed)
Charles Trevino needs to wear the CAM walker at all times except to shower.

## 2018-07-02 NOTE — Telephone Encounter (Signed)
"  I'm calling from the Emergency Department at [facility].  Mr. Charles Trevino is here stating that he's supposed to have surgery today.  We do not see him scheduled anywhere for surgery.  So I'm calling to see if you know anything about him having surgery."  He was supposed to have had surgery on June 11, 2018.  "Okay, that's all we needed.  We'll let him know."

## 2018-09-01 ENCOUNTER — Telehealth: Payer: Self-pay | Admitting: Podiatry

## 2018-09-01 NOTE — Telephone Encounter (Signed)
Patient spouse called and stated that Charles Trevino is in jail and that he needs surgery because of his ankle pain. Dr. Logan Bores would need to send some type of work to the medical office in the [employer] center. Please call patient

## 2018-09-04 NOTE — Telephone Encounter (Signed)
Surgery was indicated in November when I last saw this patient. Surgery not an option anymore because bone has likely solidified over the past 4 months. Patient just needs to continue minimal weight bearing in a CAM boot if he is still having pain. CAM boot was dispensed in the office in November. Follow up in office when patient is released from jail.  - Dr. Logan Bores

## 2018-09-07 ENCOUNTER — Encounter: Payer: Self-pay | Admitting: *Deleted

## 2018-09-07 NOTE — Telephone Encounter (Signed)
I spoke with pt's wife, Margaretmary Lombard, she states the message needs to be sent to the [facility] doctor, but they will not give her any contact information. I told Margaretmary Lombard I would mail a letter from Dr. Logan Bores to the Medical Officer at the detention center. Margaretmary Lombard states mail to [facility], [place]. [address], [place], [place] [address]. Letter mailed to [facility], Attention:  Administrator, arts.
